# Patient Record
Sex: Female | Born: 1955 | Race: White | Hispanic: No | Marital: Married | Smoking: Never smoker
Health system: Southern US, Community
[De-identification: ages and names within clinical notes are randomized; demographics above are authoritative.]

## PROBLEM LIST (undated history)

## (undated) DIAGNOSIS — I10 Essential (primary) hypertension: Secondary | ICD-10-CM

## (undated) DIAGNOSIS — K219 Gastro-esophageal reflux disease without esophagitis: Secondary | ICD-10-CM

## (undated) DIAGNOSIS — E785 Hyperlipidemia, unspecified: Secondary | ICD-10-CM

## (undated) DIAGNOSIS — E079 Disorder of thyroid, unspecified: Secondary | ICD-10-CM

## (undated) DIAGNOSIS — E039 Hypothyroidism, unspecified: Secondary | ICD-10-CM

## (undated) DIAGNOSIS — F419 Anxiety disorder, unspecified: Secondary | ICD-10-CM

## (undated) HISTORY — PX: HERNIA REPAIR: SHX51

## (undated) HISTORY — DX: Disorder of thyroid, unspecified: E07.9

## (undated) HISTORY — DX: Hyperlipidemia, unspecified: E78.5

## (undated) HISTORY — PX: ABDOMINAL HYSTERECTOMY: SHX81

## (undated) HISTORY — DX: Anxiety disorder, unspecified: F41.9

---

## 1997-09-21 ENCOUNTER — Other Ambulatory Visit: Admission: RE | Admit: 1997-09-21 | Discharge: 1997-09-21 | Payer: Self-pay | Admitting: *Deleted

## 1998-10-25 ENCOUNTER — Other Ambulatory Visit: Admission: RE | Admit: 1998-10-25 | Discharge: 1998-10-25 | Payer: Self-pay | Admitting: *Deleted

## 1998-11-03 ENCOUNTER — Other Ambulatory Visit: Admission: RE | Admit: 1998-11-03 | Discharge: 1998-11-03 | Payer: Self-pay | Admitting: *Deleted

## 1998-11-03 ENCOUNTER — Encounter (INDEPENDENT_AMBULATORY_CARE_PROVIDER_SITE_OTHER): Payer: Self-pay | Admitting: Specialist

## 2000-01-09 ENCOUNTER — Other Ambulatory Visit: Admission: RE | Admit: 2000-01-09 | Discharge: 2000-01-09 | Payer: Self-pay | Admitting: *Deleted

## 2001-02-10 ENCOUNTER — Other Ambulatory Visit: Admission: RE | Admit: 2001-02-10 | Discharge: 2001-02-10 | Payer: Self-pay | Admitting: *Deleted

## 2002-04-22 ENCOUNTER — Other Ambulatory Visit: Admission: RE | Admit: 2002-04-22 | Discharge: 2002-04-22 | Payer: Self-pay | Admitting: *Deleted

## 2003-06-06 ENCOUNTER — Other Ambulatory Visit: Admission: RE | Admit: 2003-06-06 | Discharge: 2003-06-06 | Payer: Self-pay | Admitting: *Deleted

## 2004-06-21 ENCOUNTER — Other Ambulatory Visit: Admission: RE | Admit: 2004-06-21 | Discharge: 2004-06-21 | Payer: Self-pay | Admitting: *Deleted

## 2006-10-27 ENCOUNTER — Other Ambulatory Visit: Admission: RE | Admit: 2006-10-27 | Discharge: 2006-10-27 | Payer: Self-pay | Admitting: *Deleted

## 2007-12-02 ENCOUNTER — Other Ambulatory Visit: Admission: RE | Admit: 2007-12-02 | Discharge: 2007-12-02 | Payer: Self-pay | Admitting: Gynecology

## 2016-10-02 ENCOUNTER — Encounter: Payer: Self-pay | Admitting: Cardiology

## 2016-10-21 ENCOUNTER — Encounter: Payer: Self-pay | Admitting: Cardiology

## 2016-10-21 ENCOUNTER — Ambulatory Visit (INDEPENDENT_AMBULATORY_CARE_PROVIDER_SITE_OTHER): Payer: BLUE CROSS/BLUE SHIELD | Admitting: Cardiology

## 2016-10-21 VITALS — BP 130/78 | HR 83 | Ht 65.0 in | Wt 195.0 lb

## 2016-10-21 DIAGNOSIS — E785 Hyperlipidemia, unspecified: Secondary | ICD-10-CM | POA: Diagnosis not present

## 2016-10-21 DIAGNOSIS — R0602 Shortness of breath: Secondary | ICD-10-CM | POA: Diagnosis not present

## 2016-10-21 DIAGNOSIS — E669 Obesity, unspecified: Secondary | ICD-10-CM | POA: Diagnosis not present

## 2016-10-21 HISTORY — DX: Obesity, unspecified: E66.9

## 2016-10-21 HISTORY — DX: Hyperlipidemia, unspecified: E78.5

## 2016-10-21 NOTE — Patient Instructions (Addendum)
Medication Instructions:  Your physician recommends that you continue on your current medications as directed. Please refer to the Current Medication list given to you today.  Labwork: None   Testing/Procedures: Your physician has requested that you have a lexiscan myoview. For further information please visit [email or web address]. Please follow instruction sheet, as given.     Follow-Up: Your physician recommends that you schedule a follow-up appointment in: 1 month with Dr. Tomie Chinaevankar    Any Other Special Instructions Will Be Listed Below (If Applicable).     If you need a refill on your cardiac medications before your next appointment, please call your pharmacy.

## 2016-10-21 NOTE — Progress Notes (Signed)
Cardiology Office Note:    Date:  10/21/2016   ID:  Allison PortsRebecca K Sarafian, DOB 1955-10-19, MRN [id number]  PCP:  Nonnie DoneSlatosky, John J., MD  Cardiologist:  Garwin Brothersajan R Shelbey Spindler, MD   Referring MD: No ref. provider found     History of Present Illness:    Allison Moses is a 61 y.o. female who is being seen today for the evaluation of Shortness of breath by Dr Egbert GaribaldiSlatosky. The patient mentions to me that recently she has noted shortness of breath. She mentions level walking that she does not have any of his symptoms. Only when she tries to walk upstairs she has this problem. No chest pain chest tightness. At the time of my evaluation she is alert awake oriented and in no distress. She leads a sedentary lifestyle. I asked him whether sexual activity aggravates the symptoms and she answered in the negative.  Past Medical History:  Diagnosis Date  . Hyperlipidemia   . Thyroid disease     History reviewed. No pertinent surgical history.  Current Medications: Current Meds  Medication Sig  . aspirin EC 81 MG tablet Take 81 mg by mouth daily.  Marland Kitchen. levothyroxine (SYNTHROID, LEVOTHROID) 175 MCG tablet Take 87.5 mcg by mouth daily.  Marland Kitchen. omeprazole (PRILOSEC) 20 MG capsule Take 20 mg by mouth daily.  . rosuvastatin (CRESTOR) 10 MG tablet Take 20 mg by mouth daily.     Allergies:   Patient has no known allergies.   Social History   Social History  . Marital status: Single    Spouse name: N/A  . Number of children: N/A  . Years of education: N/A   Social History Main Topics  . Smoking status: Never Smoker  . Smokeless tobacco: Never Used  . Alcohol use No  . Drug use: No  . Sexual activity: Not Asked   Other Topics Concern  . None   Social History Narrative  . None     Family History: The patient's family history includes Heart attack in her maternal grandmother and paternal grandfather; Hypertension in her brother; Lung cancer in her father; Multiple sclerosis in her mother.  ROS:   Please  see the history of present illness.    All other systems reviewed and are negative.  EKGs/Labs/Other Studies Reviewed:    The following studies were reviewed today: I reviewed the EKG done at our office today and tripped reveals sinus rhythm and voltage criteria for left ventricular hypertrophy with nonspecific ST-T changes.   Recent Labs: No results found for requested labs within last 8760 hours.  Recent Lipid Panel No results found for: CHOL, TRIG, HDL, CHOLHDL, VLDL, LDLCALC, LDLDIRECT  Physical Exam:    VS:  BP 130/78   Pulse 83   Ht 5\' 5"  (1.651 m)   Wt 195 lb (88.5 kg)   SpO2 98%   BMI 32.45 kg/m     Wt Readings from Last 3 Encounters:  10/21/16 195 lb (88.5 kg)     GEN: Patient is in no acute distress HEENT: Normal NECK: No JVD; No carotid bruits LYMPHATICS: No lymphadenopathy CARDIAC: 2/6 systolic murmur at the apex.Heart sounds regular. S1 and S2 heard RESPIRATORY:  Clear to auscultation without rales, wheezing or rhonchi  ABDOMEN: Soft, non-tender, non-distended MUSCULOSKELETAL:  No edema; No deformity  SKIN: Warm and dry NEUROLOGIC:  Alert and oriented x 3 PSYCHIATRIC:  Normal affect   ASSESSMENT:    1. Non morbid obesity, unspecified obesity type   2. Shortness of breath  3. Dyslipidemia    PLAN:    In order of problems listed above:  1. I discussed my findings with the patient. I discussed with diet or obesity and dyslipidemia and she vocalized understanding she is planning to get on a good diet regimen to lose weight and have better and more healthier lifestyle. I discussed findings shortness of breath. In view of this she will have exercise stress Cardiolite. In the interim if she has any significant symptoms he knows to go to the nearest emergency room. Her blood pressure stable.   Medication Adjustments/Labs and Tests Ordered: Current medicines are reviewed at length with the patient today.  Concerns regarding medicines are outlined above.  No  orders of the defined types were placed in this encounter.  No orders of the defined types were placed in this encounter.   Signed, Garwin Brothers, MD  10/21/2016 12:11 PM     [facility]

## 2016-10-23 ENCOUNTER — Telehealth (HOSPITAL_COMMUNITY): Payer: Self-pay | Admitting: *Deleted

## 2016-10-23 NOTE — Telephone Encounter (Signed)
Left message on voicemail in reference to upcoming appointment scheduled for 10/25/16 Phone number given for a call back so details instructions can be given. Allison Moses Jacqueline   

## 2016-10-24 ENCOUNTER — Telehealth: Payer: Self-pay | Admitting: Cardiology

## 2016-10-24 NOTE — Telephone Encounter (Signed)
Per previous encounter from yesterday, Lysbeth PennerMary Smith attempted to raech the patient. Message routed to proper caller.

## 2016-10-24 NOTE — Telephone Encounter (Signed)
New Message ° ° pt verbalized that she is returning call for rn °

## 2016-10-25 ENCOUNTER — Ambulatory Visit (HOSPITAL_COMMUNITY): Payer: BLUE CROSS/BLUE SHIELD | Attending: Cardiovascular Disease

## 2016-10-25 DIAGNOSIS — R0602 Shortness of breath: Secondary | ICD-10-CM | POA: Insufficient documentation

## 2016-10-25 DIAGNOSIS — E785 Hyperlipidemia, unspecified: Secondary | ICD-10-CM | POA: Insufficient documentation

## 2016-10-25 MED ORDER — TECHNETIUM TC 99M TETROFOSMIN IV KIT
32.7000 | PACK | Freq: Once | INTRAVENOUS | Status: AC | PRN
  Administered 2016-10-25: 32.7 via INTRAVENOUS
  Filled 2016-10-25: qty 33

## 2016-10-30 ENCOUNTER — Ambulatory Visit (HOSPITAL_COMMUNITY): Payer: BLUE CROSS/BLUE SHIELD | Attending: Internal Medicine

## 2016-10-30 LAB — MYOCARDIAL PERFUSION IMAGING
CHL CUP NUCLEAR SDS: 11
CHL CUP NUCLEAR SRS: 9
CHL CUP RESTING HR STRESS: 76 {beats}/min
Estimated workload: 7 METS
Exercise duration (min): 6 min
Exercise duration (sec): 0 s
LHR: 0.3
LV dias vol: 62 mL (ref 46–106)
LVSYSVOL: 17 mL
MPHR: 161 {beats}/min
NUC STRESS TID: 0.87
Peak HR: 169 {beats}/min
Percent HR: 104 %
SSS: 20

## 2016-10-30 MED ORDER — TECHNETIUM TC 99M TETROFOSMIN IV KIT
31.6000 | PACK | Freq: Once | INTRAVENOUS | Status: AC | PRN
  Administered 2016-10-30: 31.6 via INTRAVENOUS
  Filled 2016-10-30: qty 32

## 2016-11-21 ENCOUNTER — Encounter: Payer: Self-pay | Admitting: Cardiology

## 2016-11-21 ENCOUNTER — Ambulatory Visit (INDEPENDENT_AMBULATORY_CARE_PROVIDER_SITE_OTHER): Payer: BLUE CROSS/BLUE SHIELD | Admitting: Cardiology

## 2016-11-21 VITALS — BP 114/80 | HR 85 | Ht 65.0 in | Wt 196.0 lb

## 2016-11-21 DIAGNOSIS — E785 Hyperlipidemia, unspecified: Secondary | ICD-10-CM

## 2016-11-21 DIAGNOSIS — E669 Obesity, unspecified: Secondary | ICD-10-CM

## 2016-11-21 NOTE — Progress Notes (Signed)
Cardiology Office Note:    Date:  11/21/2016   ID:  Allison Moses, DOB 11/30/1955, MRN [id number]  PCP:  Nonnie Done., MD  Cardiologist:  Garwin Brothers, MD   Referring MD: Nonnie Done., MD    ASSESSMENT:    1. Dyslipidemia   2. Non morbid obesity, unspecified obesity type    PLAN:    In order of problems listed above:  1. Primary prevention stressed to the patient. Importance of compliance with diet and medications stressed and she verbalized understanding stress test reports were discussed with her at extensive length. Her blood pressure stable. Diet was discussed for obesity and this of obesity explained and she verbalized understanding. She will be seen in follow-up appointment in 6 months or earlier if she has any concerns.   Medication Adjustments/Labs and Tests Ordered: Current medicines are reviewed at length with the patient today.  Concerns regarding medicines are outlined above.  No orders of the defined types were placed in this encounter.  No orders of the defined types were placed in this encounter.    Chief Complaint  Patient presents with  . Follow-up    pt states she had testing done about 1 month ago and is following up.      History of Present Illness:    Allison Moses is a 61 y.o. female the patient was evaluated by me for shortness of breath. She is on the obese side. She underwent stress testing which was unremarkable. She tells me that she is very reassured about it and has started an exercise and diet program. Her symptoms have subsided. At the time of my evaluation she is alert awake oriented and in no distress. She is very happy about results.  Past Medical History:  Diagnosis Date  . Hyperlipidemia   . Thyroid disease     History reviewed. No pertinent surgical history.  Current Medications: Current Meds  Medication Sig  . aspirin EC 81 MG tablet Take 81 mg by mouth daily.  Marland Kitchen levothyroxine (SYNTHROID, LEVOTHROID) 175 MCG  tablet Take 87.5 mcg by mouth daily.  Marland Kitchen omeprazole (PRILOSEC) 20 MG capsule Take 20 mg by mouth daily.  . rosuvastatin (CRESTOR) 10 MG tablet Take 10 mg by mouth daily.   . Vitamin D, Ergocalciferol, (DRISDOL) 50000 units CAPS capsule Take 50,000 Units by mouth once a week.     Allergies:   Patient has no known allergies.   Social History   Social History  . Marital status: Single    Spouse name: N/A  . Number of children: N/A  . Years of education: N/A   Social History Main Topics  . Smoking status: Never Smoker  . Smokeless tobacco: Never Used  . Alcohol use No  . Drug use: No  . Sexual activity: Not Asked   Other Topics Concern  . None   Social History Narrative  . None     Family History: The patient's family history includes Heart attack in her maternal grandmother and paternal grandfather; Hypertension in her brother; Lung cancer in her father; Multiple sclerosis in her mother.  ROS:   Please see the history of present illness.    All other systems reviewed and are negative.  EKGs/Labs/Other Studies Reviewed:    The following studies were reviewed today: I discussed my findings with the patient at extensive length and a stress test report was discussed with her. Questions were answered to her satisfaction   Recent Labs: No results found  for requested labs within last 8760 hours.  Recent Lipid Panel No results found for: CHOL, TRIG, HDL, CHOLHDL, VLDL, LDLCALC, LDLDIRECT  Physical Exam:    VS:  BP 114/80   Pulse 85   Ht 5\' 5"  (1.651 m)   Wt 196 lb (88.9 kg)   SpO2 98%   BMI 32.62 kg/m     Wt Readings from Last 3 Encounters:  11/21/16 196 lb (88.9 kg)  10/21/16 195 lb (88.5 kg)     GEN: Patient is in no acute distress HEENT: Normal NECK: No JVD; No carotid bruits LYMPHATICS: No lymphadenopathy CARDIAC: Hear sounds regular, 2/6 systolic murmur at the apex. RESPIRATORY:  Clear to auscultation without rales, wheezing or rhonchi  ABDOMEN: Soft,  non-tender, non-distended MUSCULOSKELETAL:  No edema; No deformity  SKIN: Warm and dry NEUROLOGIC:  Alert and oriented x 3 PSYCHIATRIC:  Normal affect   Signed, Garwin Brothersajan R Iriel Nason, MD  11/21/2016 11:59 AM    [facility]

## 2016-11-21 NOTE — Patient Instructions (Signed)
Medication Instructions:   Your physician recommends that you continue on your current medications as directed. Please refer to the Current Medication list given to you today.   Labwork:  NONE  Testing/Procedures:  NONE  Follow-Up:  Your physician recommends that you schedule a follow-up appointment in: 6 months with Dr. Revankar.    Any Other Special Instructions Will Be Listed Below (If Applicable).     If you need a refill on your cardiac medications before your next appointment, please call your pharmacy.   

## 2016-12-31 HISTORY — PX: COLONOSCOPY: SHX174

## 2019-03-13 IMAGING — NM NM MISC PROCEDURE
6 series · 36 of 36 positions shown · non-contrast
Comparison: none

[Series 1: stress · 6.51mm/px · 6 of 64 frames shown (1 of 2)]
[frame 6/64]
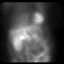
[frame 16/64]
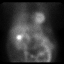
[frame 27/64]
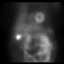
[frame 38/64]
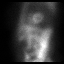
[frame 48/64]
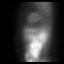
[frame 59/64]
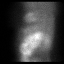

[Series 1: wbr_s-proj_st stress · 6.51mm/px · 6 of 512 frames shown (1 of 2)]
[frame 43/512]
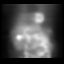
[frame 128/512]
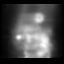
[frame 214/512]
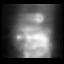
[frame 299/512]
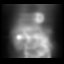
[frame 384/512]
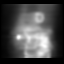
[frame 470/512]
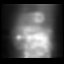

[Series 1: wbr_s-proj_st stress · 6.51mm/px · 6 of 64 frames shown (2 of 2)]
[frame 6/64]
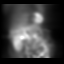
[frame 16/64]
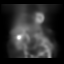
[frame 27/64]
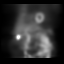
[frame 38/64]
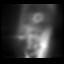
[frame 48/64]
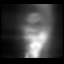
[frame 59/64]
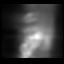

[Series 1: stress · 6.51mm/px · 6 of 496 frames shown (2 of 2)]
[frame 42/496  full-range]
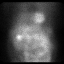
[frame 124/496  full-range]
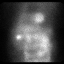
[frame 207/496  full-range]
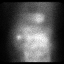
[frame 290/496  full-range]
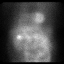
[frame 372/496  full-range]
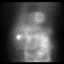
[frame 455/496  full-range]
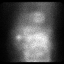

[Series 2: wbr_r-proj_st rest · 6.51mm/px · 6 of 64 frames shown]
[frame 6/64]
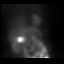
[frame 16/64]
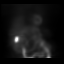
[frame 27/64]
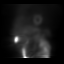
[frame 38/64]
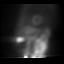
[frame 48/64]
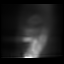
[frame 59/64]
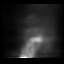

[Series 2: rest · 6.51mm/px · 6 of 64 frames shown]
[frame 6/64]
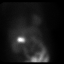
[frame 16/64]
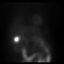
[frame 27/64]
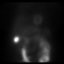
[frame 38/64]
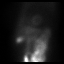
[frame 48/64]
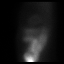
[frame 59/64]
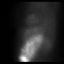

[36 of 36 positions shown; findings below may reference images not displayed]

Canned report from images found in remote index.

Refer to host system for actual result text.

## 2019-03-18 ENCOUNTER — Other Ambulatory Visit: Payer: Self-pay

## 2019-03-18 ENCOUNTER — Encounter: Payer: Self-pay | Admitting: Cardiology

## 2019-03-18 ENCOUNTER — Ambulatory Visit (INDEPENDENT_AMBULATORY_CARE_PROVIDER_SITE_OTHER): Payer: BC Managed Care – PPO | Admitting: Cardiology

## 2019-03-18 VITALS — BP 118/76 | HR 97 | Ht 64.5 in | Wt 203.0 lb

## 2019-03-18 DIAGNOSIS — I2089 Other forms of angina pectoris: Secondary | ICD-10-CM

## 2019-03-18 DIAGNOSIS — E785 Hyperlipidemia, unspecified: Secondary | ICD-10-CM | POA: Diagnosis not present

## 2019-03-18 DIAGNOSIS — I208 Other forms of angina pectoris: Secondary | ICD-10-CM

## 2019-03-18 DIAGNOSIS — E669 Obesity, unspecified: Secondary | ICD-10-CM

## 2019-03-18 DIAGNOSIS — R0789 Other chest pain: Secondary | ICD-10-CM

## 2019-03-18 DIAGNOSIS — R079 Chest pain, unspecified: Secondary | ICD-10-CM

## 2019-03-18 HISTORY — DX: Other chest pain: R07.89

## 2019-03-18 HISTORY — DX: Other forms of angina pectoris: I20.89

## 2019-03-18 HISTORY — DX: Other forms of angina pectoris: I20.8

## 2019-03-18 NOTE — Progress Notes (Signed)
Cardiology Office Note:    Date:  03/18/2019   ID:  Allison Moses, DOB Jul 22, 1955, MRN [id number]  PCP:  Allison Moses., MD  Cardiologist:  Allison Lindau, MD   Referring MD: Allison Moses., MD    ASSESSMENT:    1. Anginal equivalent ([place])   2. Dyslipidemia   3. Non morbid obesity, unspecified obesity type   4. Chest discomfort    PLAN:    In order of problems listed above:  1. Chest discomfort: Of atypical etiology and not suggestive of coronary etiology.  I discussed this with the patient at extensive length and she vocalized understanding.  She is concerned about the symptoms.  She is very concerned about the symptoms and tells me that she has strong history of family for coronary artery disease and she is interested in further evaluation.  I will set her up for CT coronary angiography with FFR.  With her risk factors I am wondering whether she has any significant coronary artery disease that we are not able to identify on stress testing.  Her discomfort may be an angina equivalent. 2. Mixed dyslipidemia: Diet was emphasized for this and overweight status and she vocalized understanding.  Risks of obesity explained.  Lipids followed by primary care physician and will try to get a copy of blood work apparently they had sent as a copy today by fax but we have not received.   3. Patient will be seen in follow-up appointment in 6 months or earlier if the patient has any concerns    Medication Adjustments/Labs and Tests Ordered: Current medicines are reviewed at length with the patient today.  Concerns regarding medicines are outlined above.  No orders of the defined types were placed in this encounter.  No orders of the defined types were placed in this encounter.    Chief Complaint  Patient presents with  . Follow-up     History of Present Illness:    Allison Moses is a 63 y.o. female.  Patient was evaluated for chest discomfort in the past she is overweight and  has history of dyslipidemia.  She denies any problems at this time and takes care of activities of daily living.  No chest pain orthopnea or PND.  She has chest discomfort she says all along the day.  She walks 20 to 25 minutes and the chest discomfort does not get worse.  At the time of my evaluation, the patient is alert awake oriented and in no distress.  Past Medical History:  Diagnosis Date  . Hyperlipidemia   . Thyroid disease     No past surgical history on file.  Current Medications: Current Meds  Medication Sig  . aspirin EC 325 MG tablet Take 325 mg by mouth daily.   . cholecalciferol (VITAMIN D3) 25 MCG (1000 UT) tablet Take 1,000 Units by mouth daily.  Marland Kitchen levothyroxine (SYNTHROID, LEVOTHROID) 175 MCG tablet Take 87.5 mcg by mouth daily.  . metoprolol tartrate (LOPRESSOR) 25 MG tablet Take 25 mg by mouth 2 (two) times daily.  Marland Kitchen omeprazole (PRILOSEC) 40 MG capsule Take 40 mg by mouth daily.   . rosuvastatin (CRESTOR) 10 MG tablet Take 10 mg by mouth daily.      Allergies:   Patient has no known allergies.   Social History   Socioeconomic History  . Marital status: Single    Spouse name: Not on file  . Number of children: Not on file  . Years of education: Not  on file  . Highest education level: Not on file  Occupational History  . Not on file  Tobacco Use  . Smoking status: Never Smoker  . Smokeless tobacco: Never Used  Substance and Sexual Activity  . Alcohol use: No  . Drug use: No  . Sexual activity: Not on file  Other Topics Concern  . Not on file  Social History Narrative  . Not on file   Social Determinants of Health   Financial Resource Strain:   . Difficulty of Paying Living Expenses: Not on file  Food Insecurity:   . Worried About Programme researcher, broadcasting/film/video in the Last Year: Not on file  . Ran Out of Food in the Last Year: Not on file  Transportation Needs:   . Lack of Transportation (Medical): Not on file  . Lack of Transportation (Non-Medical): Not  on file  Physical Activity:   . Days of Exercise per Week: Not on file  . Minutes of Exercise per Session: Not on file  Stress:   . Feeling of Stress : Not on file  Social Connections:   . Frequency of Communication with Friends and Family: Not on file  . Frequency of Social Gatherings with Friends and Family: Not on file  . Attends Religious Services: Not on file  . Active Member of Clubs or Organizations: Not on file  . Attends Banker Meetings: Not on file  . Marital Status: Not on file     Family History: The patient's family history includes Heart attack in her maternal grandmother and paternal grandfather; Hypertension in her brother; Lung cancer in her father; Multiple sclerosis in her mother.  ROS:   Please see the history of present illness.    All other systems reviewed and are negative.  EKGs/Labs/Other Studies Reviewed:    The following studies were reviewed today: Study Highlights   Nuclear stress EF: 72%.  Blood pressure demonstrated a normal response to exercise.  There was no ST segment deviation noted during stress.  The study is normal.  This is a low risk study.  The left ventricular ejection fraction is hyperdynamic (>65%).        Recent Labs: No results found for requested labs within last 8760 hours.  Recent Lipid Panel No results found for: CHOL, TRIG, HDL, CHOLHDL, VLDL, LDLCALC, LDLDIRECT  Physical Exam:    VS:  BP 118/76   Pulse 97   Ht 5' 4.5" (1.638 m)   Wt 203 lb (92.1 kg)   SpO2 97%   BMI 34.31 kg/m     Wt Readings from Last 3 Encounters:  03/18/19 203 lb (92.1 kg)  11/21/16 196 lb (88.9 kg)  10/21/16 195 lb (88.5 kg)     GEN: Patient is in no acute distress HEENT: Normal NECK: No JVD; No carotid bruits LYMPHATICS: No lymphadenopathy CARDIAC: Hear sounds regular, 2/6 systolic murmur at the apex. RESPIRATORY:  Clear to auscultation without rales, wheezing or rhonchi  ABDOMEN: Soft, non-tender,  non-distended MUSCULOSKELETAL:  No edema; No deformity  SKIN: Warm and dry NEUROLOGIC:  Alert and oriented x 3 PSYCHIATRIC:  Normal affect   Signed, Garwin Brothers, MD  03/18/2019 5:07 PM    [facility]

## 2019-03-18 NOTE — Patient Instructions (Signed)
Medication Instructions:  Your physician recommends that you continue on your current medications as directed. Please refer to the Current Medication list given to you today.  *If you need a refill on your cardiac medications before your next appointment, please call your pharmacy*  Lab Work: Your physician recommends that you return for lab work in:   5-7 days before your Coronary CT, you will need to come to our office to have BMET drawn  If you have labs (blood work) drawn today and your tests are completely normal, you will receive your results only by: Marland Kitchen MyChart Message (if you have MyChart) OR . A paper copy in the mail If you have any lab test that is abnormal or we need to change your treatment, we will call you to review the results.  Testing/Procedures: Your cardiac CT will be scheduled at one of the below locations:   [facility] [address] [place], [place] [address] [phone]  If scheduled at [facility], please arrive at the [facility] main entrance of [facility] 30-45 minutes prior to test start time. Proceed to the [facility] Radiology Department (first floor) to check-in and test prep.  Please follow these instructions carefully (unless otherwise directed):   On the Night Before the Test: . Be sure to Drink plenty of water. . Do not consume any caffeinated/decaffeinated beverages or chocolate 12 hours prior to your test. . Do not take any antihistamines 12 hours prior to your test.  On the Day of the Test: . Drink plenty of water. Do not drink any water within one hour of the test. . Do not eat any food 4 hours prior to the test. . You may take your regular medications prior to the test.  . Take Your Morning Metoprolol (Lopressor) dose (25 mg) two hours prior to test. . FEMALES- please wear underwire-free bra if available   *For Clinical Staff only. Please instruct patient the following:*        -Drink plenty of  water       -Take metoprolol (Lopressor) 2 hours prior to test (if applicable).           After the Test: . Drink plenty of water. . After receiving IV contrast, you may experience a mild flushed feeling. This is normal. . On occasion, you may experience a mild rash up to 24 hours after the test. This is not dangerous. If this occurs, you can take Benadryl 25 mg and increase your fluid intake. . If you experience trouble breathing, this can be serious. If it is severe call 911 IMMEDIATELY. If it is mild, please call our office   Once we have confirmed authorization from your insurance company, we will call you to set up a date and time for your test.   For non-scheduling related questions, please contact the cardiac imaging nurse navigator should you have any questions/concerns: Rockwell Alexandria, RN Navigator Cardiac Imaging [facility] [phone] Office    Follow-Up: At [facility], you and your health needs are our priority.  As part of our continuing mission to provide you with exceptional heart care, we have created designated Provider Care Teams.  These Care Teams include your primary Cardiologist (physician) and Advanced Practice Providers (APPs -  Physician Assistants and Nurse Practitioners) who all work together to provide you with the care you need, when you need it.  Your next appointment:   6 month(s)  The format for  your next appointment:   In Person  Provider:   Jyl Heinz, MD

## 2019-04-21 DIAGNOSIS — E282 Polycystic ovarian syndrome: Secondary | ICD-10-CM

## 2019-04-21 HISTORY — DX: Polycystic ovarian syndrome: E28.2

## 2019-04-22 ENCOUNTER — Ambulatory Visit: Payer: BC Managed Care – PPO

## 2019-04-22 DIAGNOSIS — E559 Vitamin D deficiency, unspecified: Secondary | ICD-10-CM | POA: Insufficient documentation

## 2019-04-22 HISTORY — DX: Vitamin D deficiency, unspecified: E55.9

## 2019-04-23 ENCOUNTER — Other Ambulatory Visit: Payer: Self-pay

## 2019-04-23 ENCOUNTER — Other Ambulatory Visit
Admission: RE | Admit: 2019-04-23 | Discharge: 2019-04-23 | Disposition: A | Discharge status: Home / Self Care | Payer: BC Managed Care – PPO | Source: Ambulatory Visit | Attending: Cardiology | Admitting: Cardiology

## 2019-04-23 DIAGNOSIS — E785 Hyperlipidemia, unspecified: Secondary | ICD-10-CM | POA: Diagnosis not present

## 2019-04-23 DIAGNOSIS — I208 Other forms of angina pectoris: Secondary | ICD-10-CM

## 2019-04-23 DIAGNOSIS — R0789 Other chest pain: Secondary | ICD-10-CM

## 2019-04-23 LAB — BASIC METABOLIC PANEL
Anion gap: 8 (ref 5–15)
BUN: 11 mg/dL (ref 8–23)
CO2: 27 mmol/L (ref 22–32)
Calcium: 9.1 mg/dL (ref 8.9–10.3)
Chloride: 106 mmol/L (ref 98–111)
Creatinine, Ser: 0.65 mg/dL (ref 0.44–1.00)
GFR calc Af Amer: >60 mL/min (ref >60)
GFR calc non Af Amer: >60 mL/min (ref >60)
Glucose, Bld: 94 mg/dL (ref 70–99)
Potassium: 4.4 mmol/L (ref 3.5–5.1)
Sodium: 141 mmol/L (ref 135–145)

## 2019-04-23 NOTE — Progress Notes (Signed)
Your physician has recommended you make the following change in your medication: Stop the metoprolol.  [facility]

## 2019-04-27 ENCOUNTER — Telehealth: Payer: Self-pay

## 2019-04-27 NOTE — Telephone Encounter (Signed)
Left message for patient to call office for results, copy sent to Dr. Egbert Garibaldi

## 2019-04-27 NOTE — Telephone Encounter (Signed)
-----   Message from Garwin Brothers, MD sent at 04/23/2019  1:38 PM EST ----- The results of the study is unremarkable. Please inform patient. I will discuss in detail at next appointment. Cc  primary care/referring physician Garwin Brothers, MD 04/23/2019 1:38 PM

## 2019-04-27 NOTE — Telephone Encounter (Signed)
Results relayed, no further questions. 

## 2019-04-28 ENCOUNTER — Encounter (HOSPITAL_COMMUNITY): Payer: Self-pay

## 2019-04-28 ENCOUNTER — Telehealth (HOSPITAL_COMMUNITY): Payer: Self-pay | Admitting: Emergency Medicine

## 2019-04-28 NOTE — Telephone Encounter (Signed)
Reaching out to patient to offer assistance regarding upcoming cardiac imaging study; pt verbalizes understanding of appt date/time, parking situation and where to check in, pre-test NPO status and medications ordered, and verified current allergies; name and call back number provided for further questions should they arise Manha Amato RN Navigator Cardiac Imaging [facility] [phone] office [phone] cell 

## 2019-04-29 ENCOUNTER — Ambulatory Visit
Admission: RE | Admit: 2019-04-29 | Discharge: 2019-04-29 | Disposition: A | Discharge status: Home / Self Care | Payer: BC Managed Care – PPO | Source: Ambulatory Visit | Attending: Cardiology | Admitting: Cardiology

## 2019-04-29 ENCOUNTER — Other Ambulatory Visit: Payer: Self-pay

## 2019-04-29 DIAGNOSIS — I208 Other forms of angina pectoris: Secondary | ICD-10-CM | POA: Diagnosis not present

## 2019-04-29 DIAGNOSIS — R079 Chest pain, unspecified: Secondary | ICD-10-CM | POA: Diagnosis not present

## 2019-04-29 HISTORY — DX: Gastro-esophageal reflux disease without esophagitis: K21.9

## 2019-04-29 HISTORY — DX: Hypothyroidism, unspecified: E03.9

## 2019-04-29 HISTORY — DX: Essential (primary) hypertension: I10

## 2019-04-29 MED ORDER — IOHEXOL 350 MG/ML SOLN
100.0000 mL | Freq: Once | INTRAVENOUS | Status: AC | PRN
  Administered 2019-04-29: 100 mL via INTRAVENOUS

## 2019-04-29 MED ORDER — METOPROLOL TARTRATE 5 MG/5ML IV SOLN
5.0000 mg | INTRAVENOUS | Status: DC | PRN
  Administered 2019-04-29 (×3): 5 mg via INTRAVENOUS

## 2019-04-29 MED ORDER — METOPROLOL TARTRATE 5 MG/5ML IV SOLN
10.0000 mg | Freq: Once | INTRAVENOUS | Status: AC
  Administered 2019-04-29: 10 mg via INTRAVENOUS

## 2019-04-29 MED ORDER — NITROGLYCERIN 0.4 MG SL SUBL
0.8000 mg | SUBLINGUAL_TABLET | Freq: Once | SUBLINGUAL | Status: AC
  Administered 2019-04-29: 0.8 mg via SUBLINGUAL

## 2019-04-29 NOTE — Progress Notes (Signed)
Patient tolerated CT without incident. Drank a coke after. Ambulatory steady gait to exit.

## 2019-05-07 ENCOUNTER — Other Ambulatory Visit: Payer: Self-pay

## 2019-05-28 NOTE — Progress Notes (Addendum)
This note is not being shared with the patient for the following reason:pt had a call.

## 2021-01-12 ENCOUNTER — Other Ambulatory Visit: Payer: Self-pay

## 2021-01-12 ENCOUNTER — Ambulatory Visit (INDEPENDENT_AMBULATORY_CARE_PROVIDER_SITE_OTHER): Payer: BC Managed Care – PPO | Admitting: Vascular Surgery

## 2021-01-12 ENCOUNTER — Encounter: Payer: Self-pay | Admitting: Vascular Surgery

## 2021-01-12 VITALS — BP 137/85 | HR 83 | Temp 98.5°F | Resp 20 | Ht 64.5 in | Wt 203.0 lb

## 2021-01-12 DIAGNOSIS — I70208 Unspecified atherosclerosis of native arteries of extremities, other extremity: Secondary | ICD-10-CM

## 2021-01-12 NOTE — Progress Notes (Signed)
Office Note     CC: History of left upper extremity pain Requesting Provider:  Nonnie Done., MD  HPI: Allison Moses is a 65 y.o. (Dec 16, 1955) female who presents at the request of her PCP after having an episode of left upper extremity pain 6 weeks ago.  At that time, she was evaluated in the ED and underwent CT angiogram of the left upper extremity which demonstrated occluded radial artery.  The pain she described was mainly in the bicep tricep portion of the left arm.  She denied increased pain with use, rest pain, ulcerations at the hand.  The pain resolved on its on after a few days and has not returned.  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   Allison Moses's surgical history includes 2 C-sections, hernia repair.  She is unsure if any of these operations required an arterial line. She has no history of cardiac cath.  The pt is on a statin for cholesterol management.  The pt is on a daily aspirin.   Other AC:  - The pt is not on medications for hypertension.   The pt is not diabetic.   Tobacco hx:  never  Past Medical History:  Diagnosis Date   GERD (gastroesophageal reflux disease)    Hyperlipidemia    Hypertension    Hypothyroidism    Thyroid disease     Past Surgical History:  Procedure Laterality Date   ABDOMINAL HYSTERECTOMY      Social History   Socioeconomic History   Marital status: Married    Spouse name: Not on file   Number of children: Not on file   Years of education: Not on file   Highest education level: Not on file  Occupational History   Not on file  Tobacco Use   Smoking status: Never   Smokeless tobacco: Never  Vaping Use   Vaping Use: Never used  Substance and Sexual Activity   Alcohol use: No   Drug use: No   Sexual activity: Not on file  Other Topics Concern   Not on file  Social History Narrative   Not on file   Social Determinants of Health   Financial Resource Strain: Not on file   Food Insecurity: Not on file  Transportation Needs: Not on file  Physical Activity: Not on file  Stress: Not on file  Social Connections: Not on file  Intimate Partner Violence: Not on file    Family History  Problem Relation Age of Onset   Hypertension Brother    Heart attack Maternal Grandmother    Heart attack Paternal Grandfather    Multiple sclerosis Mother    Lung cancer Father     Current Outpatient Medications  Medication Sig Dispense Refill   aspirin EC 81 MG tablet Take 81 mg by mouth daily. Swallow whole.     Cholecalciferol (VITAMIN D3) 1.25 MG (50000 UT) CAPS Take 1 capsule by mouth once a week.     levothyroxine (SYNTHROID, LEVOTHROID) 175 MCG tablet Take 87.5 mcg by mouth daily.  2   omeprazole (PRILOSEC) 40 MG capsule Take 40 mg by mouth daily.   2   rosuvastatin (CRESTOR) 10 MG tablet Take 10 mg by mouth daily.   1   No current facility-administered medications for this visit.    No Known Allergies   REVIEW OF SYSTEMS:   [X]  denotes positive finding, [ ]  denotes negative finding Cardiac  Comments:  Chest pain or chest  pressure:    Shortness of breath upon exertion:    Short of breath when lying flat:    Irregular heart rhythm:        Vascular    Pain in calf, thigh, or hip brought on by ambulation:    Pain in feet at night that wakes you up from your sleep:     Blood clot in your veins:    Leg swelling:         Pulmonary    Oxygen at home:    Productive cough:     Wheezing:         Neurologic    Sudden weakness in arms or legs:     Sudden numbness in arms or legs:     Sudden onset of difficulty speaking or slurred speech:    Temporary loss of vision in one eye:     Problems with dizziness:         Gastrointestinal    Blood in stool:     Vomited blood:         Genitourinary    Burning when urinating:     Blood in urine:        Psychiatric    Major depression:         Hematologic    Bleeding problems:    Problems with blood  clotting too easily:        Skin    Rashes or ulcers:        Constitutional    Fever or chills:      PHYSICAL EXAMINATION:  Vitals:   01/12/21 0900  BP: 137/85  Pulse: 83  Resp: 20  Temp: 98.5 F (36.9 C)  SpO2: 95%  Weight: 203 lb (92.1 kg)  Height: 5' 4.5" (1.638 m)    General:  WDWN in NAD; vital signs documented above Gait: Not observed HENT: WNL, normocephalic Pulmonary: normal non-labored breathing , without Rales, rhonchi,  wheezing Cardiac: regular HR,  Abdomen: soft, NT, no masses Skin: without rashes Vascular Exam/Pulses:  Right Left  Radial 2+ (normal) 2+ (normal)  Ulnar 2+ (normal) absent          DP 2+ (normal) 2+ (normal)  PT 2+ (normal) 2+ (normal)   Left palmar arch with multiphasic signal, digital arteries with multiphasic signal.  Extremities: without ischemic changes, without Gangrene , without cellulitis; without open wounds;  Musculoskeletal: no muscle wasting or atrophy  Neurologic: A&O X 3;  No focal weakness or paresthesias are detected Psychiatric:  The pt has Normal affect.   Non-Invasive Vascular Imaging:   Previous CT angiogram of the left lower extremity was reviewed demonstrating occlusion of the radial artery from the takeoff of the tibial artery, to the hand.    ASSESSMENT/PLAN:: 65 y.o. female presenting after an episode of left upper arm pain with imaging findings demonstrating radial artery occlusion.  The patient's initial ED complaint of upper arm pain is not congruent with the CT findings of radial artery occlusion.  Fortunately, the patient does not have any complaints of effort-induced pain, rest pain, tissue loss in the arm or hand. Physical exam demonstrated palpable ulnar, multiphasic palmar arch, multiphasic digital signals in multiple fingers.   CT demonstrates no lesions in the chest or left upper extremity that could have led to thrombosis.  She has no history of atrial fibrillation, and in most cases, cardiac  embolism lodges at branch points which is inconsistent with her radial artery occlusion. Patient is unaware if she required  an A-line for any of her previous surgeries.    I think the radial artery occlusion could be an incidental finding. The patient has been asymptomatic since her initial presentation of right upper arm pain. Jazmarie has no signs or symptoms of arterial insufficiency in the left upper extremity.  She would benefit from baseline left upper extremity duplex ultrasound in an effort to define flow velocities.  Would also recommend undergoing echo to ensure no cardiac thrombus.  I will call the patient with results of her left upper extremity arterial duplex ultrasound.  She was asked to take an aspirin and statin lifelong.   Victorino Sparrow, MD [facility] [phone]

## 2021-01-22 ENCOUNTER — Encounter (HOSPITAL_COMMUNITY): Payer: BC Managed Care – PPO

## 2021-01-22 ENCOUNTER — Other Ambulatory Visit: Payer: Self-pay

## 2021-01-22 DIAGNOSIS — I70208 Unspecified atherosclerosis of native arteries of extremities, other extremity: Secondary | ICD-10-CM

## 2021-01-24 ENCOUNTER — Ambulatory Visit (HOSPITAL_COMMUNITY)
Admission: RE | Admit: 2021-01-24 | Discharge: 2021-01-24 | Disposition: A | Discharge status: Home / Self Care | Payer: BC Managed Care – PPO | Source: Ambulatory Visit | Attending: Vascular Surgery | Admitting: Vascular Surgery

## 2021-01-24 ENCOUNTER — Other Ambulatory Visit: Payer: Self-pay | Admitting: Vascular Surgery

## 2021-01-24 ENCOUNTER — Other Ambulatory Visit: Payer: Self-pay

## 2021-01-24 DIAGNOSIS — I70208 Unspecified atherosclerosis of native arteries of extremities, other extremity: Secondary | ICD-10-CM | POA: Diagnosis not present

## 2021-02-15 DIAGNOSIS — E039 Hypothyroidism, unspecified: Secondary | ICD-10-CM | POA: Insufficient documentation

## 2021-02-15 DIAGNOSIS — I10 Essential (primary) hypertension: Secondary | ICD-10-CM | POA: Insufficient documentation

## 2021-02-15 DIAGNOSIS — E079 Disorder of thyroid, unspecified: Secondary | ICD-10-CM | POA: Insufficient documentation

## 2021-02-15 DIAGNOSIS — E785 Hyperlipidemia, unspecified: Secondary | ICD-10-CM | POA: Insufficient documentation

## 2021-02-15 DIAGNOSIS — K219 Gastro-esophageal reflux disease without esophagitis: Secondary | ICD-10-CM | POA: Insufficient documentation

## 2021-02-16 ENCOUNTER — Other Ambulatory Visit: Payer: Self-pay

## 2021-02-16 ENCOUNTER — Encounter: Payer: Self-pay | Admitting: Cardiology

## 2021-02-16 ENCOUNTER — Ambulatory Visit: Payer: BC Managed Care – PPO | Admitting: Cardiology

## 2021-02-16 VITALS — BP 128/80 | HR 93 | Ht 64.6 in | Wt 202.8 lb

## 2021-02-16 DIAGNOSIS — E785 Hyperlipidemia, unspecified: Secondary | ICD-10-CM

## 2021-02-16 DIAGNOSIS — I208 Other forms of angina pectoris: Secondary | ICD-10-CM

## 2021-02-16 NOTE — Patient Instructions (Signed)
Medication Instructions:  Your physician recommends that you continue on your current medications as directed. Please refer to the Current Medication list given to you today.  *If you need a refill on your cardiac medications before your next appointment, please call your pharmacy*   Lab Work: None ordered If you have labs (blood work) drawn today and your tests are completely normal, you will receive your results only by: MyChart Message (if you have MyChart) OR A paper copy in the mail If you have any lab test that is abnormal or we need to change your treatment, we will call you to review the results.   Testing/Procedures: None ordered   Follow-Up: At [facility], you and your health needs are our priority.  As part of our continuing mission to provide you with exceptional heart care, we have created designated Provider Care Teams.  These Care Teams include your primary Cardiologist (physician) and Advanced Practice Providers (APPs -  Physician Assistants and Nurse Practitioners) who all work together to provide you with the care you need, when you need it.  We recommend signing up for the patient portal called "MyChart".  Sign up information is provided on this After Visit Summary.  MyChart is used to connect with patients for Virtual Visits (Telemedicine).  Patients are able to view lab/test results, encounter notes, upcoming appointments, etc.  Non-urgent messages can be sent to your provider as well.   To learn more about what you can do with MyChart, go to [email or web address].    Your next appointment:   4 month(s)  The format for your next appointment:   In Person  Provider:   Rajan Revankar, MD   Other Instructions NA  

## 2021-02-16 NOTE — Progress Notes (Signed)
Cardiology Office Note:    Date:  02/16/2021   ID:  Allison Moses, DOB 1956-01-20, MRN [id number]  PCP:  Nonnie Done., MD  Cardiologist:  Garwin Brothers, MD   Referring MD: Nonnie Done., MD    ASSESSMENT:    No diagnosis found. PLAN:    In order of problems listed above:  Primary prevention stressed with the patient.  Importance of compliance with diet medication stressed and she vocalized understanding.  She was advised to walk at least half an hour a day 5 days a week and she promises to do so. Chest pain: Atypical in nature.  I reassured her about this.  I discussed CT coronary angiography report also at length with her. Obesity: Weight reduction was stressed.  Diet emphasized.  She plans to do better with diet and exercise. Mixed dyslipidemia: Lipids followed by primary care and I discussed diet. Patient will be seen in follow-up appointment in 6 months or earlier if the patient has any concerns    Medication Adjustments/Labs and Tests Ordered: Current medicines are reviewed at length with the patient today.  Concerns regarding medicines are outlined above.  No orders of the defined types were placed in this encounter.  No orders of the defined types were placed in this encounter.    No chief complaint on file.    History of Present Illness:    Allison Moses is a 65 y.o. female.  Patient has past medical history of essential hypertension dyslipidemia and obesity.  She leads a sedentary lifestyle.  She mentions to me that she is here for follow-up.  She has had coronary calcium scoring which was 0 in 2021.  Her coronary arteries were fine.  She tells me that she had left arm pain so she is concerned.  Ultrasound of the left arm was unremarkable.  I reviewed those notes.  At the time of my evaluation, the patient is alert awake oriented and in no distress.  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   Chest discomfort  03/18/2019   Dyslipidemia 10/21/2016   GERD (gastroesophageal reflux disease)    Hyperlipidemia    Hypertension    Hypothyroidism    Non morbid obesity, unspecified obesity type 10/21/2016   Polycystic ovary syndrome 04/21/2019   Thyroid disease    Vitamin D deficiency 04/22/2019    Past Surgical History:  Procedure Laterality Date   ABDOMINAL HYSTERECTOMY      Current Medications: Current Meds  Medication Sig   aspirin EC 81 MG tablet Take 81 mg by mouth daily. Swallow whole.   Cholecalciferol (VITAMIN D3) 1.25 MG (50000 UT) CAPS Take 1 capsule by mouth once a week.   levothyroxine (SYNTHROID, LEVOTHROID) 175 MCG tablet Take 87.5 mcg by mouth daily.   omeprazole (PRILOSEC) 40 MG capsule Take 40 mg by mouth daily.    rosuvastatin (CRESTOR) 10 MG tablet Take 10 mg by mouth daily.      Allergies:   Patient has no known allergies.   Social History   Socioeconomic History   Marital status: Married    Spouse name: Not on file   Number of children: Not on file   Years of education: Not on file   Highest education level: Not on file  Occupational History   Not on file  Tobacco Use   Smoking status: Never   Smokeless tobacco: Never  Vaping Use   Vaping Use: Never used  Substance and Sexual  Activity   Alcohol use: No   Drug use: No   Sexual activity: Not on file  Other Topics Concern   Not on file  Social History Narrative   Not on file   Social Determinants of Health   Financial Resource Strain: Not on file  Food Insecurity: Not on file  Transportation Needs: Not on file  Physical Activity: Not on file  Stress: Not on file  Social Connections: Not on file     Family History: The patient's family history includes Heart attack in her maternal grandmother and paternal grandfather; Hypertension in her brother; Lung cancer in her father; Multiple sclerosis in her mother.  ROS:   Please see the history of present illness.    All other systems reviewed and are  negative.  EKGs/Labs/Other Studies Reviewed:    The following studies were reviewed today: IMPRESSION: 1. Coronary calcium score of 0. This was 0 percentile for age and sex matched control.   2. Normal coronary origin with right dominance.   3. No evidence of CAD.   4. CAD-RADS 0. No evidence of CAD (0%). Consider non-atherosclerotic causes of chest pain.     Electronically Signed   By: Debbe Odea M.D.   On: 04/29/2019 14:13     Recent Labs: No results found for requested labs within last 8760 hours.  Recent Lipid Panel No results found for: CHOL, TRIG, HDL, CHOLHDL, VLDL, LDLCALC, LDLDIRECT  Physical Exam:    VS:  BP 128/80   Pulse 93   Ht 5' 4.6" (1.641 m)   Wt 202 lb 12.8 oz (92 kg)   SpO2 95%   BMI 34.17 kg/m     Wt Readings from Last 3 Encounters:  02/16/21 202 lb 12.8 oz (92 kg)  01/12/21 203 lb (92.1 kg)  03/18/19 203 lb (92.1 kg)     GEN: Patient is in no acute distress HEENT: Normal NECK: No JVD; No carotid bruits LYMPHATICS: No lymphadenopathy CARDIAC: Hear sounds regular, 2/6 systolic murmur at the apex. RESPIRATORY:  Clear to auscultation without rales, wheezing or rhonchi  ABDOMEN: Soft, non-tender, non-distended MUSCULOSKELETAL:  No edema; No deformity  SKIN: Warm and dry NEUROLOGIC:  Alert and oriented x 3 PSYCHIATRIC:  Normal affect   Signed, Garwin Brothers, MD  02/16/2021 4:18 PM    [facility]

## 2021-07-06 ENCOUNTER — Ambulatory Visit: Payer: BC Managed Care – PPO | Admitting: Cardiology

## 2022-06-28 ENCOUNTER — Telehealth: Payer: Self-pay | Admitting: Gastroenterology

## 2022-06-28 NOTE — Telephone Encounter (Signed)
Hi Dr. Lyndel Safe,  Patient called requesting a transfer of care from Dr. Earlean Shawl office specifically over to you. Her insurance no longer participates with their office. She needs to be evaluated for continued GERD symptoms. Records were requested for you to review and advise on scheduling.   Thank you

## 2022-09-05 NOTE — Progress Notes (Signed)
09/06/2022 Allison Moses [id number] [birth year]  Referring provider: Nonnie Done., MD Primary GI doctor: Dr. Chales Abrahams  ASSESSMENT AND PLAN:   Gastroesophageal reflux disease, persistent despite once daily Ppi therapy, with nocturnal symptoms, occ GERD Lifestyle changes discussed, avoid NSAIDS, ETOH Will increase PPI to twice daily, emphasizing before food., Will schedule for EGD. , and Weight loss discussed with patient in detail. Will schedule EGD. I discussed risks of EGD with patient today, including risk of sedation, bleeding or perforation.  Patient provides understanding and gave verbal consent to proceed.  History of adenomatous polyp of colon 2018 7 mm TA polyp, recall 5 years Will schedule for colon at [facility] We have discussed the risks of bleeding, infection, perforation, medication reactions, and remote risk of death associated with colonoscopy. All questions were answered and the patient acknowledges these risk and wishes to proceed.   Patient Care Team: Nonnie Done., MD as PCP - General (Family Medicine)  HISTORY OF PRESENT ILLNESS: 67 y.o. female with a past medical history of hypothyroidism, hyperlipidemia, CT coronary artery 0 in 2021 and others listed below presents for evaluation of abdominal pain, wishing to transfer care from Dr. Kinnie Scales.   2012 colonoscopy unremarkable 2018 colonoscopy for abdominal discomfort descending colon 7 mm tubular adenoma, recall 5 years Due 2023 Patient denies family history of colon cancer or other gastrointestinal malignancies but parents died 61 and 35 from MS and lung cancer.   Patient reports GERD, denies any associated AB pain.  She reports nocturnal symptoms with 2-3 episodes of regurgitation at night.  She states she can intermittently feel food move slowly down. Previous barium swallow 5 years ago.  She denies melena, nausea, vomiting.  She  denies AB bloating.  No unintentional weight loss, no night sweats. Bm's  alternate between hard and soft, has BM almost daily. No hematochezia.   She reports NSAID use rare ibuprofen use once a week.  She denies ETOH use.   She denies family history of gallbladder issues.  She denies tobacco use.  She denies drug use.    She  reports that she has never smoked. She has never used smokeless tobacco. She reports that she does not drink alcohol and does not use drugs.  RELEVANT LABS AND IMAGING: CBC No results found for: "WBC", "RBC", "HGB", "HCT", "PLT", "MCV", "MCH", "MCHC", "RDW", "LYMPHSABS", "MONOABS", "EOSABS", "BASOSABS" No results for input(s): "HGB" in the last 8760 hours.  CMP     Component Value Date/Time   NA 141 04/23/2019 1223   K 4.4 04/23/2019 1223   CL 106 04/23/2019 1223   CO2 27 04/23/2019 1223   GLUCOSE 94 04/23/2019 1223   BUN 11 04/23/2019 1223   CREATININE 0.65 04/23/2019 1223   CALCIUM 9.1 04/23/2019 1223   GFRNONAA >60 04/23/2019 1223   GFRAA >60 04/23/2019 1223       No data to display            Current Medications:   Current Outpatient Medications (Endocrine & Metabolic):    levothyroxine (SYNTHROID, LEVOTHROID) 175 MCG tablet, Take 87.5 mcg by mouth daily.  Current Outpatient Medications (Cardiovascular):    rosuvastatin (CRESTOR) 10 MG tablet, Take 10 mg by mouth daily.    Current Outpatient Medications (Analgesics):    aspirin EC 81 MG tablet, Take 81 mg by mouth daily. Swallow whole.   Current Outpatient Medications (Other):    Cholecalciferol (VITAMIN D3) 1.25 MG (50000 UT) CAPS, Take 1 capsule by mouth once a  week.   Na Sulfate-K Sulfate-Mg Sulf 17.5-3.13-1.6 GM/177ML SOLN, Take 1 kit by mouth once for 1 dose.   omeprazole (PRILOSEC) 40 MG capsule, Take 40 mg by mouth daily.   Medical History:  Past Medical History:  Diagnosis Date   Anginal equivalent 03/18/2019   Chest discomfort 03/18/2019   Dyslipidemia 10/21/2016   GERD (gastroesophageal reflux disease)    Hyperlipidemia    Hypertension     Hypothyroidism    Non morbid obesity, unspecified obesity type 10/21/2016   Polycystic ovary syndrome 04/21/2019   Thyroid disease    Vitamin D deficiency 04/22/2019   Allergies: No Known Allergies   Surgical History:  She  has a past surgical history that includes Abdominal hysterectomy and Colonoscopy (12/31/2016). Family History:  Her family history includes Heart attack in her maternal grandmother and paternal grandfather; Hypertension in her brother; Lung cancer in her father; Multiple sclerosis in her mother.  REVIEW OF SYSTEMS  : All other systems reviewed and negative except where noted in the History of Present Illness.  PHYSICAL EXAM: BP 122/80   Pulse 80   Ht 5\' 4"  (1.626 m)   Wt 205 lb (93 kg)   BMI 35.19 kg/m  General Appearance: Well nourished, in no apparent distress. Head:   Normocephalic and atraumatic. Eyes:  sclerae anicteric,conjunctive pink  Respiratory: Respiratory effort normal, BS equal bilaterally without rales, rhonchi, wheezing. Cardio: RRR with no MRGs. Peripheral pulses intact.  Abdomen: Soft,  Obese ,active bowel sounds. No tenderness . Without guarding and Without rebound. No masses. Rectal: Not evaluated Musculoskeletal: Full ROM, Normal gait. Without edema. Skin:  Dry and intact without significant lesions or rashes Neuro: Alert and  oriented x4;  No focal deficits. Psych:  Cooperative. Normal mood and affect.    Doree Albee, PA-C 3:35 PM

## 2022-09-06 ENCOUNTER — Encounter: Payer: Self-pay | Admitting: Physician Assistant

## 2022-09-06 ENCOUNTER — Ambulatory Visit: Payer: Medicare Other | Admitting: Physician Assistant

## 2022-09-06 VITALS — BP 122/80 | HR 80 | Ht 64.0 in | Wt 205.0 lb

## 2022-09-06 DIAGNOSIS — K219 Gastro-esophageal reflux disease without esophagitis: Secondary | ICD-10-CM | POA: Diagnosis not present

## 2022-09-06 DIAGNOSIS — Z86010 Personal history of colonic polyps: Secondary | ICD-10-CM

## 2022-09-06 MED ORDER — NA SULFATE-K SULFATE-MG SULF 17.5-3.13-1.6 GM/177ML PO SOLN: 1.0000 | Freq: Once | ORAL | 0 refills | Status: AC

## 2022-09-06 NOTE — Patient Instructions (Addendum)
You have been scheduled for a colonoscopy/EGD. Please follow written instructions given to you at your visit today.  Please pick up your prep supplies at the pharmacy within the next 1-3 days. If you use inhalers (even only as needed), please bring them with you on the day of your procedure.   Please take this medication 30 minutes to 1 hour before meals- this makes it more effective.  Avoid spicy and acidic foods Avoid fatty foods Limit your intake of coffee, tea, alcohol, and carbonated drinks Work to maintain a healthy weight Keep the head of the bed elevated at least 3 inches with blocks or a wedge pillow if you are having any nighttime symptoms Stay upright for 2 hours after eating Avoid meals and snacks three to four hours before bedtime  _____________________________________________________  If your blood pressure at your visit was 140/90 or greater, please contact your primary care physician to follow up on this.  _______________________________________________________  If you are age 74 or older, your body mass index should be between 23-30. Your Body mass index is 35.19 kg/m. If this is out of the aforementioned range listed, please consider follow up with your Primary Care Provider.  If you are age 58 or younger, your body mass index should be between 19-25. Your Body mass index is 35.19 kg/m. If this is out of the aformentioned range listed, please consider follow up with your Primary Care Provider.   ________________________________________________________  The [facility] providers would like to encourage you to use [facility] to communicate with providers for non-urgent requests or questions.  Due to long hold times on the telephone, sending your provider a message by [facility] may be a faster and more efficient way to get a response.  Please allow 48 business hours for a response.  Please remember that this is for non-urgent requests.   _______________________________________________________ It was a pleasure to see you today!  Thank you for trusting me with your gastrointestinal care!

## 2022-09-07 NOTE — Progress Notes (Signed)
Agree with assessment/plan.  Raj Aleph Nickson, MD [facility] [phone]  

## 2022-10-11 ENCOUNTER — Encounter: Payer: Self-pay | Admitting: Gastroenterology

## 2022-10-18 ENCOUNTER — Telehealth: Payer: Self-pay | Admitting: Physician Assistant

## 2022-10-18 NOTE — Telephone Encounter (Signed)
Patient is calling back regarding message below . She  said she is trying to avoid get charge for late cancellation if her insurance is not in network.

## 2022-10-18 NOTE — Telephone Encounter (Signed)
Inbound call from patient requesting to speak about pre certification for upcoming 7/19 procedure. Would like to  make sure her insurance is in network. Please advise, thank you.

## 2022-10-21 NOTE — Telephone Encounter (Signed)
Inbound call from patient requesting a call back regarding insurance for 7/19 procedure. Advised there is a 48 hour response period. Patient is also worried about being charged if she cancels 7/19 procedure if her insurance does not cover procedure. Please advise, thank you.

## 2022-10-25 ENCOUNTER — Encounter: Payer: Self-pay | Admitting: Gastroenterology

## 2022-10-25 ENCOUNTER — Ambulatory Visit (AMBULATORY_SURGERY_CENTER): Payer: Medicare Other | Admitting: Gastroenterology

## 2022-10-25 VITALS — BP 108/74 | HR 69 | Temp 98.0°F | Resp 15 | Ht 64.0 in | Wt 205.0 lb

## 2022-10-25 DIAGNOSIS — D124 Benign neoplasm of descending colon: Secondary | ICD-10-CM

## 2022-10-25 DIAGNOSIS — D120 Benign neoplasm of cecum: Secondary | ICD-10-CM

## 2022-10-25 DIAGNOSIS — K319 Disease of stomach and duodenum, unspecified: Secondary | ICD-10-CM

## 2022-10-25 DIAGNOSIS — K639 Disease of intestine, unspecified: Secondary | ICD-10-CM

## 2022-10-25 DIAGNOSIS — D122 Benign neoplasm of ascending colon: Secondary | ICD-10-CM

## 2022-10-25 DIAGNOSIS — D123 Benign neoplasm of transverse colon: Secondary | ICD-10-CM

## 2022-10-25 DIAGNOSIS — Z09 Encounter for follow-up examination after completed treatment for conditions other than malignant neoplasm: Secondary | ICD-10-CM | POA: Diagnosis present

## 2022-10-25 DIAGNOSIS — K219 Gastro-esophageal reflux disease without esophagitis: Secondary | ICD-10-CM

## 2022-10-25 DIAGNOSIS — Z86010 Personal history of colonic polyps: Secondary | ICD-10-CM

## 2022-10-25 MED ORDER — SODIUM CHLORIDE 0.9 % IV SOLN: 500.0000 mL | INTRAVENOUS | Status: DC

## 2022-10-25 NOTE — Progress Notes (Signed)
Referring provider: Nonnie Done., MD Primary GI doctor: Dr. Chales Abrahams   ASSESSMENT AND PLAN:    Gastroesophageal reflux disease, persistent despite once daily Ppi therapy, with nocturnal symptoms, occ GERD Lifestyle changes discussed, avoid NSAIDS, ETOH Will increase PPI to twice daily, emphasizing before food., Will schedule for EGD. , and Weight loss discussed with patient in detail. Will schedule EGD. I discussed risks of EGD with patient today, including risk of sedation, bleeding or perforation.  Patient provides understanding and gave verbal consent to proceed.   History of adenomatous polyp of colon 2018 7 mm TA polyp, recall 5 years Will schedule for colon at [facility] We have discussed the risks of bleeding, infection, perforation, medication reactions, and remote risk of death associated with colonoscopy. All questions were answered and the patient acknowledges these risk and wishes to proceed.     Patient Care Team: Nonnie Done., MD as PCP - General (Family Medicine)   HISTORY OF PRESENT ILLNESS: 67 y.o. female with a past medical history of hypothyroidism, hyperlipidemia, CT coronary artery 0 in 2021 and others listed below presents for evaluation of abdominal pain, wishing to transfer care from Dr. Kinnie Scales.    2012 colonoscopy unremarkable 2018 colonoscopy for abdominal discomfort descending colon 7 mm tubular adenoma, recall 5 years Due 2023 Patient denies family history of colon cancer or other gastrointestinal malignancies but parents died 62 and 38 from MS and lung cancer.    Patient reports GERD, denies any associated AB pain.  She reports nocturnal symptoms with 2-3 episodes of regurgitation at night.  She states she can intermittently feel food move slowly down. Previous barium swallow 5 years ago.  She denies melena, nausea, vomiting.  She  denies AB bloating.  No unintentional weight loss, no night sweats. Bm's alternate between hard and soft, has BM almost  daily. No hematochezia.    She reports NSAID use rare ibuprofen use once a week.  She denies ETOH use.   She denies family history of gallbladder issues.  She denies tobacco use.  She denies drug use.     She  reports that she has never smoked. She has never used smokeless tobacco. She reports that she does not drink alcohol and does not use drugs.   RELEVANT LABS AND IMAGING: CBC Labs (Brief)  No results found for: "WBC", "RBC", "HGB", "HCT", "PLT", "MCV", "MCH", "MCHC", "RDW", "LYMPHSABS", "MONOABS", "EOSABS", "BASOSABS"   Recent Labs (within last 365 days)  No results for input(s): "HGB" in the last 8760 hours.     CMP     Labs (Brief)          Component Value Date/Time    NA 141 04/23/2019 1223    K 4.4 04/23/2019 1223    CL 106 04/23/2019 1223    CO2 27 04/23/2019 1223    GLUCOSE 94 04/23/2019 1223    BUN 11 04/23/2019 1223    CREATININE 0.65 04/23/2019 1223    CALCIUM 9.1 04/23/2019 1223    GFRNONAA >60 04/23/2019 1223    GFRAA >60 04/23/2019 1223            No data to display             Current Medications:    Current Outpatient Medications (Endocrine & Metabolic):    levothyroxine (SYNTHROID, LEVOTHROID) 175 MCG tablet, Take 87.5 mcg by mouth daily.   Current Outpatient Medications (Cardiovascular):    rosuvastatin (CRESTOR) 10 MG tablet, Take 10 mg by mouth daily.  Current Outpatient Medications (Analgesics):    aspirin EC 81 MG tablet, Take 81 mg by mouth daily. Swallow whole.     Current Outpatient Medications (Other):    Cholecalciferol (VITAMIN D3) 1.25 MG (50000 UT) CAPS, Take 1 capsule by mouth once a week.   Na Sulfate-K Sulfate-Mg Sulf 17.5-3.13-1.6 GM/177ML SOLN, Take 1 kit by mouth once for 1 dose.   omeprazole (PRILOSEC) 40 MG capsule, Take 40 mg by mouth daily.    Medical History:      Past Medical History:  Diagnosis Date   Anginal equivalent 03/18/2019   Chest discomfort 03/18/2019   Dyslipidemia 10/21/2016   GERD  (gastroesophageal reflux disease)     Hyperlipidemia     Hypertension     Hypothyroidism     Non morbid obesity, unspecified obesity type 10/21/2016   Polycystic ovary syndrome 04/21/2019   Thyroid disease     Vitamin D deficiency 04/22/2019        Allergies:  Allergies  No Known Allergies      Surgical History:  She  has a past surgical history that includes Abdominal hysterectomy and Colonoscopy (12/31/2016). Family History:  Her family history includes Heart attack in her maternal grandmother and paternal grandfather; Hypertension in her brother; Lung cancer in her father; Multiple sclerosis in her mother.   REVIEW OF SYSTEMS  : All other systems reviewed and negative except where noted in the History of Present Illness.   PHYSICAL EXAM: BP 122/80   Pulse 80   Ht 5\' 4"  (1.626 m)   Wt 205 lb (93 kg)   BMI 35.19 kg/m  General Appearance: Well nourished, in no apparent distress. Head:   Normocephalic and atraumatic. Eyes:  sclerae anicteric,conjunctive pink  Respiratory: Respiratory effort normal, BS equal bilaterally without rales, rhonchi, wheezing. Cardio: RRR with no MRGs. Peripheral pulses intact.  Abdomen: Soft,  Obese ,active bowel sounds. No tenderness . Without guarding and Without rebound. No masses. Rectal: Not evaluated Musculoskeletal: Full ROM, Normal gait. Without edema. Skin:  Dry and intact without significant lesions or rashes Neuro: Alert and  oriented x4;  No focal deficits. Psych:  Cooperative. Normal mood and affect.      Doree Albee, PA-C

## 2022-10-25 NOTE — Progress Notes (Signed)
Sedate, gd SR, tolerated procedure well, VSS, report to RN 

## 2022-10-25 NOTE — Patient Instructions (Signed)
Thank you for coming in to see [place] today. Resume previous diet and medications today. Return to normal daily activities tomorrow. AVOID ALL NSAIDS for 5 DAYS   (IBUPROFEN, ALEVE, FULL STRENGTH ASPIRIN, MOBIC, etc)  Follow up office appointment in 12 weeks.  Please call next week to set this up.  Biopsy results will be available in 1-2 weeks.  You will be notified of results and if any additional treatment is needed.   YOU HAD AN ENDOSCOPIC PROCEDURE TODAY AT THE  ENDOSCOPY CENTER:   Refer to the procedure report that was given to you for any specific questions about what was found during the examination.  If the procedure report does not answer your questions, please call your gastroenterologist to clarify.  If you requested that your care partner not be given the details of your procedure findings, then the procedure report has been included in a sealed envelope for you to review at your convenience later.  YOU SHOULD EXPECT: Some feelings of bloating in the abdomen. Passage of more gas than usual.  Walking can help get rid of the air that was put into your GI tract during the procedure and reduce the bloating. If you had a lower endoscopy (such as a colonoscopy or flexible sigmoidoscopy) you may notice spotting of blood in your stool or on the toilet paper. If you underwent a bowel prep for your procedure, you may not have a normal bowel movement for a few days.  Please Note:  You might notice some irritation and congestion in your nose or some drainage.  This is from the oxygen used during your procedure.  There is no need for concern and it should clear up in a day or so.  SYMPTOMS TO REPORT IMMEDIATELY:  Following lower endoscopy (colonoscopy or flexible sigmoidoscopy):  Excessive amounts of blood in the stool  Significant tenderness or worsening of abdominal pains  Swelling of the abdomen that is new, acute  Fever of 100F or higher  Following upper endoscopy (EGD)  Vomiting of  blood or coffee ground material  New chest pain or pain under the shoulder blades  Painful or persistently difficult swallowing  New shortness of breath  Fever of 100F or higher  Black, tarry-looking stools  For urgent or emergent issues, a gastroenterologist can be reached at any hour by calling [phone]. Do not use MyChart messaging for urgent concerns.    DIET:  We do recommend a small meal at first, but then you may proceed to your regular diet.  Drink plenty of fluids but you should avoid alcoholic beverages for 24 hours.  ACTIVITY:  You should plan to take it easy for the rest of today and you should NOT DRIVE or use heavy machinery until tomorrow (because of the sedation medicines used during the test).    FOLLOW UP: Our staff will call the number listed on your records the next business day following your procedure.  We will call around 7:15- 8:00 am to check on you and address any questions or concerns that you may have regarding the information given to you following your procedure. If we do not reach you, we will leave a message.     If any biopsies were taken you will be contacted by phone or by letter within the next 1-3 weeks.  Please call us at [phone] if you have not heard about the biopsies in 3 weeks.    SIGNATURES/CONFIDENTIALITY: You and/or your care partner have signed paperwork which  will be entered into your electronic medical record.  These signatures attest to the fact that that the information above on your After Visit Summary has been reviewed and is understood.  Full responsibility of the confidentiality of this discharge information lies with you and/or your care-partner.

## 2022-10-25 NOTE — Progress Notes (Signed)
Patient states there have been no changes to medical or surgical history since time of pre-visit. 

## 2022-10-25 NOTE — Progress Notes (Signed)
Called to room to assist during endoscopic procedure.  Patient ID and intended procedure confirmed with present staff. Received instructions for my participation in the procedure from the performing physician.  

## 2022-10-28 ENCOUNTER — Telehealth: Payer: Self-pay

## 2022-10-28 NOTE — Telephone Encounter (Signed)
  Follow up Call-     10/25/2022   12:49 PM  Call back number  Post procedure Call Back phone  # [phone]  Permission to leave phone message Yes     Patient questions:  Do you have a fever, pain , or abdominal swelling? No. Pain Score  0 *  Have you tolerated food without any problems? Yes.    Have you been able to return to your normal activities? Yes.    Do you have any questions about your discharge instructions: Diet   No. Medications  No. Follow up visit  No.  Do you have questions or concerns about your Care? No.  Actions: * If pain score is 4 or above: No action needed, pain <4.

## 2022-11-07 ENCOUNTER — Encounter: Payer: Self-pay | Admitting: Gastroenterology

## 2023-01-30 ENCOUNTER — Ambulatory Visit: Payer: Medicare Other | Admitting: Gastroenterology

## 2023-01-30 ENCOUNTER — Encounter: Payer: Self-pay | Admitting: Gastroenterology

## 2023-01-30 VITALS — BP 136/80 | HR 96 | Ht 64.0 in | Wt 208.0 lb

## 2023-01-30 DIAGNOSIS — K219 Gastro-esophageal reflux disease without esophagitis: Secondary | ICD-10-CM

## 2023-01-30 DIAGNOSIS — Z860101 Personal history of adenomatous and serrated colon polyps: Secondary | ICD-10-CM

## 2023-01-30 MED ORDER — FAMOTIDINE 20 MG PO TABS: 20.0000 mg | ORAL_TABLET | Freq: Every day | ORAL | 3 refills | Status: AC

## 2023-01-30 NOTE — Patient Instructions (Addendum)
_______________________________________________________  If your blood pressure at your visit was 140/90 or greater, please contact your primary care physician to follow up on this.  _______________________________________________________  If you are age 66 or older, your body mass index should be between 23-30. Your Body mass index is 35.7 kg/m. If this is out of the aforementioned range listed, please consider follow up with your Primary Care Provider.  If you are age 7 or younger, your body mass index should be between 19-25. Your Body mass index is 35.7 kg/m. If this is out of the aformentioned range listed, please consider follow up with your Primary Care Provider.   ________________________________________________________  The [place] [facility] providers would like to encourage you to use [facility] to communicate with providers for non-urgent requests or questions.  Due to long hold times on the telephone, sending your provider a message by [facility] may be a faster and more efficient way to get a response.  Please allow 48 business hours for a response.  Please remember that this is for non-urgent requests.  _______________________________________________________  Omeprazole 20mg  daily and Pepcid 20mg  at bedtime  Repeat colonoscopy for 10-2025. Please call 2 months prior to schedule this. A letter will be sent as it gets closer.  Call with any questions or concerns  Thank you,  Dr. Lynann Bologna

## 2023-01-30 NOTE — Progress Notes (Signed)
Chief Complaint: FU  Referring Provider:  Nonnie Done., MD      ASSESSMENT AND PLAN;   #1. GERD. EGD 10/25/2022: small HH. Neg eso, gastric and SB bx  #2. H/O polyps 10/25/2022. Next due 3 yrs  Plan: -Omeprazole 20mg  po QD and pepcid 20mg  at bedtime -Rpt colon 3 yrs. (10/2025) -Call if any problems.     HPI:    Allison Moses is a 67 y.o. female   FU-feels a lot better FU from procedures  Doing well on omeprazole/Pepcid as above.  If she misses couple of doses, she starts having breakthrough symptoms.  She was concerned about long-term side effects of PPIs.  We have discussed that in detail.  I have also discussed details of colonoscopy.  Overall feels better    Wt Readings from Last 3 Encounters:  01/30/23 208 lb (94.3 kg)  10/25/22 205 lb (93 kg)  09/06/22 205 lb (93 kg)     Past GI procedures:  EGD 10/25/2022 (scanned under media since our computer system was down) -Mildly torturous esophagus. Neg eso Bx for EoE/reflux. -Small HH. GE jn at 36 cm -Otherwise normal EGD. -Negative gastric biopsies for H. pylori.  Negative small bowel biopsies for celiac.  Colonoscopy 10/25/2022 (report scanned under media tab)- CF -1 cm cecal polyp status post cold snare polypectomy. -6 mm ascending colon polyp s/p cold snare polypectomy -4 mm descending colon polyp status post cold snare polypectomy. -Mild sigmoid diverticulosis -Small internal hemorrhoids -Bx: Tubular adenomas, SSA. -Repeat colonoscopy in 3 years  Previous colonoscopies by Dr. Kinnie Scales 2012 colonoscopy unremarkable 2018 colonoscopy for abdominal discomfort descending colon 7 mm tubular adenoma.  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   Anxiety    Chest discomfort 03/18/2019   Dyslipidemia 10/21/2016   GERD (gastroesophageal reflux disease)    Hyperlipidemia    Hypertension    Hypothyroidism    Non morbid obesity, unspecified obesity type 10/21/2016    Polycystic ovary syndrome 04/21/2019   Thyroid disease    Vitamin D deficiency 04/22/2019    Past Surgical History:  Procedure Laterality Date   ABDOMINAL HYSTERECTOMY     COLONOSCOPY  12/31/2016   Dr Kinnie Scales. Colon polyp(s). Normal ileoscopy examination.   HERNIA REPAIR      Family History  Problem Relation Age of Onset   Multiple sclerosis Mother    Lung cancer Father    Hypertension Brother    Heart attack Maternal Grandmother    Heart attack Paternal Grandfather    Colon cancer Neg Hx    Stomach cancer Neg Hx    Esophageal cancer Neg Hx    Rectal cancer Neg Hx    Colon polyps Neg Hx     Social History   Tobacco Use   Smoking status: Never   Smokeless tobacco: Never  Vaping Use   Vaping status: Never Used  Substance Use Topics   Alcohol use: No   Drug use: No    Current Outpatient Medications  Medication Sig Dispense Refill   Cholecalciferol (VITAMIN D3) 1.25 MG (50000 UT) CAPS Take 1 capsule by mouth once a week. Pt taking Vitamin D every other week     levothyroxine (SYNTHROID, LEVOTHROID) 175 MCG tablet Take 87.5 mcg by mouth daily.  2   omeprazole (PRILOSEC) 40 MG capsule Take 40 mg by mouth daily.   2   rosuvastatin (CRESTOR) 10 MG tablet Take 10 mg by mouth daily.   1  Current Facility-Administered Medications  Medication Dose Route Frequency Provider Last Rate Last Admin   0.9 %  sodium chloride infusion  500 mL Intravenous Continuous Lynann Bologna, MD        No Known Allergies  Review of Systems:  neg     Physical Exam:    BP 136/80   Pulse 96   Ht 5\' 4"  (1.626 m)   Wt 208 lb (94.3 kg)   BMI 35.70 kg/m  Wt Readings from Last 3 Encounters:  01/30/23 208 lb (94.3 kg)  10/25/22 205 lb (93 kg)  09/06/22 205 lb (93 kg)   Constitutional:  Well-developed, in no acute distress. Psychiatric: Normal mood and affect. Behavior is normal. Abdominal: Soft, nondistended. Nontender. Bowel sounds active throughout. There are no masses palpable. No  hepatomegaly. Rectal: Deferred Neurological: Alert and oriented to person place and time. Skin: Skin is warm and dry. No rashes noted.  Data Reviewed: I have personally reviewed following labs and imaging studies  CBC:     No data to display          CMP:    Latest Ref Rng & Units 04/23/2019   12:23 PM  CMP  Glucose 70 - 99 mg/dL 94   BUN 8 - 23 mg/dL 11   Creatinine 6.64 - 1.00 mg/dL 4.03   Sodium 474 - 259 mmol/L 141   Potassium 3.5 - 5.1 mmol/L 4.4   Chloride 98 - 111 mmol/L 106   CO2 22 - 32 mmol/L 27   Calcium 8.9 - 10.3 mg/dL 9.1      Radiology Studies: No results found.    Edman Circle, MD 01/30/2023, 8:50 AM  Cc: Nonnie Done., MD

## 2023-05-31 ENCOUNTER — Encounter (HOSPITAL_BASED_OUTPATIENT_CLINIC_OR_DEPARTMENT_OTHER): Payer: Self-pay | Admitting: Emergency Medicine

## 2023-05-31 ENCOUNTER — Ambulatory Visit (HOSPITAL_BASED_OUTPATIENT_CLINIC_OR_DEPARTMENT_OTHER)
Admission: EM | Admit: 2023-05-31 | Discharge: 2023-05-31 | Disposition: A | Discharge status: Home / Self Care | Payer: Medicare Other | Attending: Family Medicine | Admitting: Family Medicine

## 2023-05-31 ENCOUNTER — Ambulatory Visit (HOSPITAL_BASED_OUTPATIENT_CLINIC_OR_DEPARTMENT_OTHER)
Admit: 2023-05-31 | Discharge: 2023-05-31 | Disposition: A | Discharge status: Home / Self Care | Payer: Medicare Other | Attending: Family Medicine | Admitting: Family Medicine

## 2023-05-31 DIAGNOSIS — N3289 Other specified disorders of bladder: Secondary | ICD-10-CM | POA: Insufficient documentation

## 2023-05-31 DIAGNOSIS — R310 Gross hematuria: Secondary | ICD-10-CM

## 2023-05-31 DIAGNOSIS — R102 Pelvic and perineal pain: Secondary | ICD-10-CM | POA: Insufficient documentation

## 2023-05-31 DIAGNOSIS — R350 Frequency of micturition: Secondary | ICD-10-CM

## 2023-05-31 LAB — POCT URINALYSIS DIP (MANUAL ENTRY)
Bilirubin, UA: NEGATIVE
Glucose, UA: NEGATIVE mg/dL
Ketones, POC UA: NEGATIVE mg/dL
Nitrite, UA: NEGATIVE
Spec Grav, UA: 1.015
Urobilinogen, UA: 0.2 U/dL
pH, UA: 6.5

## 2023-05-31 MED ORDER — NITROFURANTOIN MONOHYD MACRO 100 MG PO CAPS: 100.0000 mg | ORAL_CAPSULE | Freq: Two times a day (BID) | ORAL | 0 refills | Status: AC

## 2023-05-31 MED ORDER — FLUCONAZOLE 150 MG PO TABS: ORAL_TABLET | ORAL | 0 refills | Status: AC

## 2023-05-31 NOTE — ED Triage Notes (Signed)
 Pt reports she has blood in her urine, pain in lower pelvic area started yesterday. She had a kidney stone in 2023 symptoms similar to now.

## 2023-05-31 NOTE — Discharge Instructions (Addendum)
 Abdominal x-ray was negative for any kidney stones.  Urine was abnormal and did show moderate hematuria.  Will treat for acute UTI.  Urine culture sent.  Will adjust the plan of care, if needed once the culture results.  Nitrofurantoin, 100 mg, twice daily for 7 days.  Get plenty of fluids and rest.  Patient has jury duty on Monday, 06/02/2023.  I am providing her an excuse but states she may not be able to do jury duty.  That will be up to the court to make a final decision but I am providing information about her acute UTI.

## 2023-05-31 NOTE — ED Provider Notes (Addendum)
 Allison [facility]    CSN: [id number] Arrival date & time: 05/31/23  0807      History   Chief Complaint No chief complaint on file.   HPI Allison Moses is a 68 y.o. female.   Patient reports a history of kidney stone in July 2023.  She did have a CT scan at that time.  The CT scan showed a 5 mm stone at the left UVJ.  No stones in the other kidneys.  She has had urinary frequency, urgency and bladder spasms.  She is having lower pelvic or suprapubic abdominal pain.  She has visible blood in her urine.  The history is provided by the patient.    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   Anxiety    Chest discomfort 03/18/2019   Dyslipidemia 10/21/2016   GERD (gastroesophageal reflux disease)    Hyperlipidemia    Hypertension    Hypothyroidism    Non morbid obesity, unspecified obesity type 10/21/2016   Polycystic ovary syndrome 04/21/2019   Thyroid disease    Vitamin D deficiency 04/22/2019    Patient Active Problem List   Diagnosis Date Noted   Hyperlipidemia 02/15/2021   Thyroid disease 02/15/2021   Hypothyroidism 02/15/2021   Hypertension 02/15/2021   GERD (gastroesophageal reflux disease) 02/15/2021   Vitamin D deficiency 04/22/2019   Polycystic ovary syndrome 04/21/2019   Chest discomfort 03/18/2019   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 Colon polyp(s). Normal ileoscopy examination.   HERNIA REPAIR      OB History   No obstetric history on file.      Home Medications    Prior to Admission medications   Medication Sig Start Date End Date Taking? Authorizing Provider  Cholecalciferol (VITAMIN D3) 1.25 MG (50000 UT) CAPS Take 1 capsule by mouth once a week. Pt taking Vitamin D every other week 12/16/20  Yes  [provider]  fluconazole (DIFLUCAN) 150 MG tablet By mouth today and repeat every 5 days for vaginal yeast infection.  May use up to four pills, if needed 05/31/23  Yes Prescilla Sours, FNP  levothyroxine (SYNTHROID, LEVOTHROID) 175 MCG tablet Take 87.5 mcg by mouth daily. 10/19/16  Yes [provider]  nitrofurantoin, macrocrystal-monohydrate, (MACROBID) 100 MG capsule Take 1 capsule (100 mg total) by mouth 2 (two) times daily. 05/31/23  Yes Prescilla Sours, FNP  omeprazole (PRILOSEC) 40 MG capsule Take 40 mg by mouth daily.  10/19/16  Yes [provider]  rosuvastatin (CRESTOR) 10 MG tablet Take 10 mg by mouth daily.  10/19/16  Yes [provider]  famotidine (PEPCID) 20 MG tablet Take 1 tablet (20 mg total) by mouth at bedtime. 01/30/23   Lynann Bologna, MD    Family History Family History  Problem Relation Age of Onset   Multiple sclerosis Mother    Lung cancer Father    Hypertension Brother    Heart attack Maternal Grandmother    Heart attack Paternal Grandfather    Colon cancer Neg Hx    Stomach cancer Neg Hx    Esophageal cancer Neg Hx    Rectal cancer Neg Hx    Colon polyps Neg Hx     Social History Social History   Tobacco Use   Smoking status: Never  Smokeless tobacco: Never  Vaping Use   Vaping status: Never Used  Substance Use Topics   Alcohol use: No   Drug use: No     Allergies   Patient has no known allergies.   Review of Systems Review of Systems  Constitutional:  Negative for chills and fever.  HENT:  Negative for ear pain and sore throat.   Eyes:  Negative for pain and visual disturbance.  Respiratory:  Negative for cough and shortness of breath.   Cardiovascular:  Negative for chest pain and palpitations.  Gastrointestinal:  Positive for abdominal pain. Negative for constipation, diarrhea, nausea and vomiting.  Genitourinary:  Positive for frequency, hematuria and urgency. Negative for dysuria.  Musculoskeletal:   Negative for arthralgias and back pain.  Skin:  Negative for color change and rash.  Neurological:  Negative for seizures and syncope.  All other systems reviewed and are negative.    Physical Exam Triage Vital Signs ED Triage Vitals  Encounter Vitals Group     BP 05/31/23 0819 (!) 148/96     Systolic BP Percentile --      Diastolic BP Percentile --      Pulse Rate 05/31/23 0819 88     Resp 05/31/23 0819 20     Temp 05/31/23 0819 97.7 F (36.5 C)     Temp Source 05/31/23 0819 Oral     SpO2 05/31/23 0819 96 %     Weight --      Height --      Head Circumference --      Peak Flow --      Pain Score 05/31/23 0817 2     Pain Loc --      Pain Education --      Exclude from Growth Chart --    No data found.  Updated Vital Signs BP (!) 148/96 (BP Location: Right Arm)   Pulse 88   Temp 97.7 F (36.5 C) (Oral)   Resp 20   SpO2 96%   Visual Acuity Right Eye Distance:   Left Eye Distance:   Bilateral Distance:    Right Eye Near:   Left Eye Near:    Bilateral Near:     Physical Exam Vitals and nursing note reviewed.  Constitutional:      General: She is not in acute distress.    Appearance: She is well-developed. She is not ill-appearing or toxic-appearing.  HENT:     Head: Normocephalic and atraumatic.     Right Ear: Hearing, tympanic membrane, ear canal and external ear normal.     Left Ear: Hearing, tympanic membrane, ear canal and external ear normal.     Nose: No congestion or rhinorrhea.     Right Sinus: No maxillary sinus tenderness or frontal sinus tenderness.     Left Sinus: No maxillary sinus tenderness or frontal sinus tenderness.     Mouth/Throat:     Lips: Pink.     Mouth: Mucous membranes are moist.     Pharynx: Uvula midline. No oropharyngeal exudate or posterior oropharyngeal erythema.     Tonsils: No tonsillar exudate.  Eyes:     Conjunctiva/sclera: Conjunctivae normal.     Pupils: Pupils are equal, round, and reactive to light.   Cardiovascular:     Rate and Rhythm: Normal rate and regular rhythm.     Heart sounds: S1 normal and S2 normal. No murmur heard. Pulmonary:     Effort: Pulmonary effort is normal. No respiratory distress.  Breath sounds: Normal breath sounds. No decreased breath sounds, wheezing, rhonchi or rales.  Abdominal:     General: Bowel sounds are normal.     Palpations: Abdomen is soft.     Tenderness: There is abdominal tenderness (Mild to moderate) in the suprapubic area.  Musculoskeletal:        General: No swelling.     Cervical back: Neck supple.  Lymphadenopathy:     Head:     Right side of head: No submental, submandibular, tonsillar, preauricular or posterior auricular adenopathy.     Left side of head: No submental, submandibular, tonsillar, preauricular or posterior auricular adenopathy.     Cervical: No cervical adenopathy.     Right cervical: No superficial cervical adenopathy.    Left cervical: No superficial cervical adenopathy.  Skin:    General: Skin is warm and dry.     Capillary Refill: Capillary refill takes less than 2 seconds.     Findings: No rash.  Neurological:     Mental Status: She is alert and oriented to person, place, and time.  Psychiatric:        Mood and Affect: Mood normal.      UC Treatments / Results  Labs (all labs ordered are listed, but only abnormal results are displayed) Labs Reviewed  POCT URINALYSIS DIP (MANUAL ENTRY) - Abnormal; Notable for the following components:      Result Value   Clarity, UA cloudy (*)    Blood, UA large (*)    Protein Ur, POC trace (*)    Leukocytes, UA Large (3+) (*)    All other components within normal limits  URINE CULTURE    EKG   Radiology CT urogram done at [facility] on 10/08/21: Showed a 5 mm stone at the left UVJ.  Some hepatic steatosis.  And a right ovarian cyst measuring 2.1 cm.  No follow-up was recommended regarding the ovarian cyst.  No stones were seen in the  kidneys.  Procedures Procedures (including critical care time)  Medications Ordered in UC Medications - No data to display  Initial Impression / Assessment and Plan / UC Course  I have reviewed the triage vital signs and the nursing notes.  Pertinent labs & imaging results that were available during my care of the patient were reviewed by me and considered in my medical decision making (see chart for details).     KUB was negative for stones.  Urine showed gross hematuria and leukocytes.  Urine culture sent.  Will adjust the plan of care, if needed once the culture results.  Nitrofurantoin, 100 mg, twice daily for 7 days.  Get plenty of fluids and rest.  Provided a work excuse.  She has jury duty on Monday, 06/02/23.  Work excuse provides information about her acute UTI and that she may not be a good candidate for jury duty.  Provided fluconazole, 150 mg, 1 pill every 5 days up to a total of 4 doses, if needed for vaginal yeast infection due to antibiotic use.  We discussed this during the visit but I did not put it into the note until after I printed the AVS.  Follow-up if symptoms do not improve, worsen or new symptoms occur. Final Clinical Impressions(s) / UC Diagnoses   Final diagnoses:  Acute suprapubic pain  Gross hematuria  Bladder spasms  Urinary frequency     Discharge Instructions      Abdominal x-ray was negative for any kidney stones.  Urine was abnormal and  did show moderate hematuria.  Will treat for acute UTI.  Urine culture sent.  Will adjust the plan of care, if needed once the culture results.  Nitrofurantoin, 100 mg, twice daily for 7 days.  Get plenty of fluids and rest.  Patient has jury duty on Monday, 06/02/2023.  I am providing her an excuse but states she may not be able to do jury duty.  That will be up to the court to make a final decision but I am providing information about her acute UTI.     ED Prescriptions     Medication Sig Dispense Auth.  Provider   nitrofurantoin, macrocrystal-monohydrate, (MACROBID) 100 MG capsule Take 1 capsule (100 mg total) by mouth 2 (two) times daily. 10 capsule Prescilla Sours, FNP   fluconazole (DIFLUCAN) 150 MG tablet By mouth today and repeat every 5 days for vaginal yeast infection.  May use up to four pills, if needed 4 tablet Prescilla Sours, FNP      PDMP not reviewed this encounter.   Prescilla Sours, FNP 05/31/23 1610    Prescilla Sours, FNP 05/31/23 [id number]

## 2023-06-02 LAB — URINE CULTURE: Culture: >=100000 — AB

## 2023-11-17 ENCOUNTER — Other Ambulatory Visit: Payer: Self-pay | Admitting: Obstetrics and Gynecology

## 2023-11-17 DIAGNOSIS — Z1231 Encounter for screening mammogram for malignant neoplasm of breast: Secondary | ICD-10-CM

## 2023-12-03 ENCOUNTER — Ambulatory Visit
Admission: RE | Admit: 2023-12-03 | Discharge: 2023-12-03 | Disposition: A | Discharge status: Home / Self Care | Source: Ambulatory Visit | Attending: Obstetrics and Gynecology | Admitting: Obstetrics and Gynecology

## 2023-12-03 DIAGNOSIS — Z1231 Encounter for screening mammogram for malignant neoplasm of breast: Secondary | ICD-10-CM
